# Patient Record
Sex: Male | Born: 1990 | Race: Black or African American | Hispanic: No | Marital: Single | Smoking: Never smoker
Health system: Southern US, Community
[De-identification: ages and names within clinical notes are randomized; demographics above are authoritative.]

---

## 2016-02-19 ENCOUNTER — Emergency Department (HOSPITAL_COMMUNITY): Payer: Self-pay

## 2016-02-19 ENCOUNTER — Encounter (HOSPITAL_COMMUNITY): Payer: Self-pay

## 2016-02-19 DIAGNOSIS — Y999 Unspecified external cause status: Secondary | ICD-10-CM | POA: Insufficient documentation

## 2016-02-19 DIAGNOSIS — S8251XA Displaced fracture of medial malleolus of right tibia, initial encounter for closed fracture: Secondary | ICD-10-CM | POA: Insufficient documentation

## 2016-02-19 DIAGNOSIS — Y92410 Unspecified street and highway as the place of occurrence of the external cause: Secondary | ICD-10-CM | POA: Insufficient documentation

## 2016-02-19 DIAGNOSIS — Y939 Activity, unspecified: Secondary | ICD-10-CM | POA: Insufficient documentation

## 2016-02-19 DIAGNOSIS — S82121A Displaced fracture of lateral condyle of right tibia, initial encounter for closed fracture: Secondary | ICD-10-CM | POA: Insufficient documentation

## 2016-02-19 DIAGNOSIS — S52201A Unspecified fracture of shaft of right ulna, initial encounter for closed fracture: Secondary | ICD-10-CM | POA: Insufficient documentation
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
## 2016-02-20 ENCOUNTER — Emergency Department (HOSPITAL_COMMUNITY)
Admission: EM | Admit: 2016-02-20 | Discharge: 2016-02-20 | Disposition: A | Discharge status: Home / Self Care | Payer: Self-pay | Attending: Emergency Medicine | Admitting: Emergency Medicine

## 2016-02-20 ENCOUNTER — Emergency Department (HOSPITAL_COMMUNITY): Payer: Self-pay

## 2016-02-20 ENCOUNTER — Telehealth: Payer: Self-pay | Admitting: *Deleted

## 2016-02-20 DIAGNOSIS — S52201A Unspecified fracture of shaft of right ulna, initial encounter for closed fracture: Secondary | ICD-10-CM

## 2016-02-20 DIAGNOSIS — S8251XA Displaced fracture of medial malleolus of right tibia, initial encounter for closed fracture: Secondary | ICD-10-CM

## 2016-02-20 DIAGNOSIS — IMO0002 Reserved for concepts with insufficient information to code with codable children: Secondary | ICD-10-CM

## 2016-02-20 DIAGNOSIS — S82101A Unspecified fracture of upper end of right tibia, initial encounter for closed fracture: Secondary | ICD-10-CM

## 2016-02-20 MED ORDER — TETANUS-DIPHTH-ACELL PERTUSSIS 5-2.5-18.5 LF-MCG/0.5 IM SUSP
0.5000 mL | Freq: Once | INTRAMUSCULAR | Status: AC
  Administered 2016-02-20: 0.5 mL via INTRAMUSCULAR
  Filled 2016-02-20: qty 0.5

## 2016-02-20 MED ORDER — OXYCODONE-ACETAMINOPHEN 5-325 MG PO TABS: 1.0000 | ORAL_TABLET | Freq: Four times a day (QID) | ORAL | 0 refills | Status: AC | PRN

## 2016-02-20 MED ORDER — SODIUM CHLORIDE 0.9 % IV SOLN
Freq: Once | INTRAVENOUS | Status: AC
  Administered 2016-02-20: 05:00:00 via INTRAVENOUS

## 2016-02-20 MED ORDER — MORPHINE SULFATE (PF) 4 MG/ML IV SOLN
4.0000 mg | Freq: Once | INTRAVENOUS | Status: AC
  Administered 2016-02-20: 4 mg via INTRAVENOUS
  Filled 2016-02-20: qty 1

## 2016-02-20 MED ORDER — LIDOCAINE-EPINEPHRINE (PF) 2 %-1:200000 IJ SOLN
20.0000 mL | Freq: Once | INTRAMUSCULAR | Status: AC
  Administered 2016-02-20: 20 mL
  Filled 2016-02-20: qty 20

## 2016-02-20 MED ORDER — ONDANSETRON HCL 4 MG/2ML IJ SOLN
4.0000 mg | Freq: Once | INTRAMUSCULAR | Status: AC
  Administered 2016-02-20: 4 mg via INTRAVENOUS
  Filled 2016-02-20: qty 2

## 2016-02-20 NOTE — ED Notes (Signed)
Dr Wilkie AyeHorton at bedside to do FAST - negative.

## 2016-02-20 NOTE — ED Notes (Signed)
Dr Wilkie AyeHorton at bedside for laceration repair
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
## 2016-02-20 NOTE — ED Notes (Signed)
Patient transported to X-ray 
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
## 2016-02-20 NOTE — ED Notes (Signed)
Patient returned from xray.

## 2016-02-20 NOTE — Progress Notes (Signed)
Orthopedic Tech Progress Note Patient Details:  Patrick PootShomari Copeland 06/11/1991 [id number]  Ortho Devices Type of Ortho Device: Ace wrap, Sugartong splint, Post (short leg) splint, Knee Immobilizer Ortho Device/Splint Location: rue/rle Ortho Device/Splint Interventions: Application   Malachi BondsGriffin, Keita Demarco Ray 02/20/2016, 6:59 AM

## 2016-02-20 NOTE — Progress Notes (Signed)
[facility]
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
## 2016-02-21 ENCOUNTER — Other Ambulatory Visit: Payer: Self-pay | Admitting: Orthopedic Surgery

## 2016-02-22 ENCOUNTER — Encounter (HOSPITAL_BASED_OUTPATIENT_CLINIC_OR_DEPARTMENT_OTHER): Payer: Self-pay | Admitting: Anesthesiology

## 2016-02-23 ENCOUNTER — Encounter (HOSPITAL_BASED_OUTPATIENT_CLINIC_OR_DEPARTMENT_OTHER): Admission: RE | Payer: Self-pay | Source: Ambulatory Visit

## 2016-02-23 ENCOUNTER — Ambulatory Visit (HOSPITAL_BASED_OUTPATIENT_CLINIC_OR_DEPARTMENT_OTHER): Admission: RE | Admit: 2016-02-23 | Payer: Self-pay | Source: Ambulatory Visit | Admitting: Orthopedic Surgery

## 2016-02-23 SURGERY — OPEN REDUCTION INTERNAL FIXATION (ORIF) ULNAR FRACTURE
Anesthesia: General | Laterality: Right
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
